# Patient Record
Sex: Male | Born: 1970 | Race: White | Hispanic: No | Marital: Married | Smoking: Current every day smoker
Health system: Southern US, Community
[De-identification: ages and names within clinical notes are randomized; demographics above are authoritative.]

## PROBLEM LIST (undated history)

## (undated) DIAGNOSIS — I2510 Atherosclerotic heart disease of native coronary artery without angina pectoris: Secondary | ICD-10-CM

## (undated) DIAGNOSIS — G43909 Migraine, unspecified, not intractable, without status migrainosus: Secondary | ICD-10-CM

## (undated) DIAGNOSIS — L409 Psoriasis, unspecified: Secondary | ICD-10-CM

## (undated) DIAGNOSIS — M549 Dorsalgia, unspecified: Secondary | ICD-10-CM

## (undated) DIAGNOSIS — E785 Hyperlipidemia, unspecified: Secondary | ICD-10-CM

## (undated) DIAGNOSIS — S0990XA Unspecified injury of head, initial encounter: Secondary | ICD-10-CM

## (undated) HISTORY — PX: APPENDECTOMY: SHX54

## (undated) HISTORY — PX: INGUINAL HERNIA REPAIR: SUR1180

---

## 1991-12-29 HISTORY — PX: FACIAL RECONSTRUCTION SURGERY: SHX631

## 2011-11-25 ENCOUNTER — Ambulatory Visit: Payer: Self-pay | Admitting: Surgery

## 2011-11-25 HISTORY — PX: HERNIA REPAIR: SHX51

## 2012-08-26 ENCOUNTER — Ambulatory Visit: Payer: Self-pay | Admitting: Internal Medicine

## 2014-12-28 HISTORY — PX: CARDIAC CATHETERIZATION: SHX172

## 2015-09-09 ENCOUNTER — Ambulatory Visit
Admission: EM | Admit: 2015-09-09 | Discharge: 2015-09-09 | Disposition: A | Discharge status: Home / Self Care | Payer: BLUE CROSS/BLUE SHIELD | Attending: Family Medicine | Admitting: Family Medicine

## 2015-09-09 ENCOUNTER — Other Ambulatory Visit: Payer: Self-pay

## 2015-09-09 ENCOUNTER — Encounter: Payer: Self-pay | Admitting: Emergency Medicine

## 2015-09-09 DIAGNOSIS — Z720 Tobacco use: Secondary | ICD-10-CM

## 2015-09-09 DIAGNOSIS — R079 Chest pain, unspecified: Secondary | ICD-10-CM

## 2015-09-09 DIAGNOSIS — Z8249 Family history of ischemic heart disease and other diseases of the circulatory system: Secondary | ICD-10-CM | POA: Diagnosis not present

## 2015-09-09 DIAGNOSIS — F17210 Nicotine dependence, cigarettes, uncomplicated: Secondary | ICD-10-CM | POA: Insufficient documentation

## 2015-09-09 HISTORY — DX: Migraine, unspecified, not intractable, without status migrainosus: G43.909

## 2015-09-09 HISTORY — DX: Psoriasis, unspecified: L40.9

## 2015-09-09 HISTORY — DX: Unspecified injury of head, initial encounter: S09.90XA

## 2015-09-09 HISTORY — DX: Dorsalgia, unspecified: M54.9

## 2015-09-09 NOTE — ED Notes (Signed)
Pt reports intermittent CP mid sternal, since last week Monday. Reports CP worse when he was working but eased off when rested. Pt thought it was heartburn. Pt states now worse, doesn't ease off , also tingling bilat arms, nausea, denies vomiting.
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
## 2015-09-09 NOTE — Discharge Instructions (Signed)

## 2015-09-09 NOTE — ED Notes (Signed)
Pt reports he took 2 bayer adult aspirin today (est. )

## 2015-09-09 NOTE — ED Notes (Signed)
Notified [facility], Barbara Cower, that pt is coming by private vehicle.

## 2019-07-22 ENCOUNTER — Encounter: Payer: Self-pay | Admitting: Emergency Medicine

## 2019-07-22 ENCOUNTER — Other Ambulatory Visit: Payer: Self-pay

## 2019-07-22 ENCOUNTER — Emergency Department: Payer: BC Managed Care – PPO

## 2019-07-22 ENCOUNTER — Emergency Department
Admission: EM | Admit: 2019-07-22 | Discharge: 2019-07-22 | Disposition: A | Discharge status: Home / Self Care | Payer: BC Managed Care – PPO | Attending: Emergency Medicine | Admitting: Emergency Medicine

## 2019-07-22 DIAGNOSIS — Y9389 Activity, other specified: Secondary | ICD-10-CM | POA: Diagnosis not present

## 2019-07-22 DIAGNOSIS — Y9289 Other specified places as the place of occurrence of the external cause: Secondary | ICD-10-CM | POA: Diagnosis not present

## 2019-07-22 DIAGNOSIS — F17210 Nicotine dependence, cigarettes, uncomplicated: Secondary | ICD-10-CM | POA: Insufficient documentation

## 2019-07-22 DIAGNOSIS — W311XXA Contact with metalworking machines, initial encounter: Secondary | ICD-10-CM | POA: Diagnosis not present

## 2019-07-22 DIAGNOSIS — S61312A Laceration without foreign body of right middle finger with damage to nail, initial encounter: Secondary | ICD-10-CM | POA: Insufficient documentation

## 2019-07-22 DIAGNOSIS — S62632A Displaced fracture of distal phalanx of right middle finger, initial encounter for closed fracture: Secondary | ICD-10-CM | POA: Insufficient documentation

## 2019-07-22 DIAGNOSIS — Y998 Other external cause status: Secondary | ICD-10-CM | POA: Insufficient documentation

## 2019-07-22 DIAGNOSIS — S61310A Laceration without foreign body of right index finger with damage to nail, initial encounter: Secondary | ICD-10-CM | POA: Insufficient documentation

## 2019-07-22 MED ORDER — OXYCODONE-ACETAMINOPHEN 5-325 MG PO TABS: 1.0000 | ORAL_TABLET | Freq: Four times a day (QID) | ORAL | 0 refills | Status: AC | PRN

## 2019-07-22 MED ORDER — SULFAMETHOXAZOLE-TRIMETHOPRIM 800-160 MG PO TABS: 1.0000 | ORAL_TABLET | Freq: Two times a day (BID) | ORAL | 0 refills | Status: AC

## 2019-07-22 MED ORDER — LIDOCAINE HCL (PF) 1 % IJ SOLN
INTRAMUSCULAR | Status: AC
  Administered 2019-07-22: 5 mL
  Filled 2019-07-22: qty 5

## 2019-07-22 MED ORDER — LIDOCAINE HCL (PF) 1 % IJ SOLN
INTRAMUSCULAR | Status: AC
  Administered 2019-07-22: 20:00:00 5 mL
  Filled 2019-07-22: qty 5

## 2019-07-22 MED ORDER — ONDANSETRON HCL 4 MG PO TABS: 4.0000 mg | ORAL_TABLET | Freq: Three times a day (TID) | ORAL | 0 refills | Status: AC | PRN

## 2019-07-22 MED ORDER — CEPHALEXIN 500 MG PO CAPS: 500.0000 mg | ORAL_CAPSULE | Freq: Three times a day (TID) | ORAL | 0 refills | Status: AC

## 2019-07-22 NOTE — ED Triage Notes (Signed)
Pt arrived via POV with reports of laceration to right index and middle finger from a jointer saw.   Pt states last tetanus shot was 2 years ago.
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
## 2019-11-24 ENCOUNTER — Ambulatory Visit
Admission: EM | Admit: 2019-11-24 | Discharge: 2019-11-24 | Disposition: A | Discharge status: Home / Self Care | Payer: BC Managed Care – PPO | Attending: Family Medicine | Admitting: Family Medicine

## 2019-11-24 ENCOUNTER — Encounter: Payer: Self-pay | Admitting: Emergency Medicine

## 2019-11-24 ENCOUNTER — Other Ambulatory Visit: Payer: Self-pay

## 2019-11-24 DIAGNOSIS — W228XXA Striking against or struck by other objects, initial encounter: Secondary | ICD-10-CM | POA: Diagnosis not present

## 2019-11-24 DIAGNOSIS — S0501XA Injury of conjunctiva and corneal abrasion without foreign body, right eye, initial encounter: Secondary | ICD-10-CM | POA: Diagnosis not present

## 2019-11-24 MED ORDER — ERYTHROMYCIN 5 MG/GM OP OINT: 1.0000 "application " | TOPICAL_OINTMENT | Freq: Four times a day (QID) | OPHTHALMIC | 0 refills | Status: AC

## 2019-11-24 NOTE — Discharge Instructions (Addendum)
Use medication as prescribed. Avoid rubbing. Monitor.  Follow-up with ophthalmology this week as planned.  Follow up with your primary care physician this week as needed. Return to Urgent care for new or worsening concerns.

## 2019-11-24 NOTE — ED Triage Notes (Signed)
Patient states he has a grass clipping stuck in his right eye since this morning.
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
## 2019-11-28 IMAGING — DX RIGHT HAND - COMPLETE 3+ VIEW
3 series · 3 of 3 positions shown · non-contrast
Comparison: None

CLINICAL DATA: Lacerations to distal index and middle fingers from
a saw

EXAM:
RIGHT HAND - COMPLETE 3+ VIEW

[hand ap]
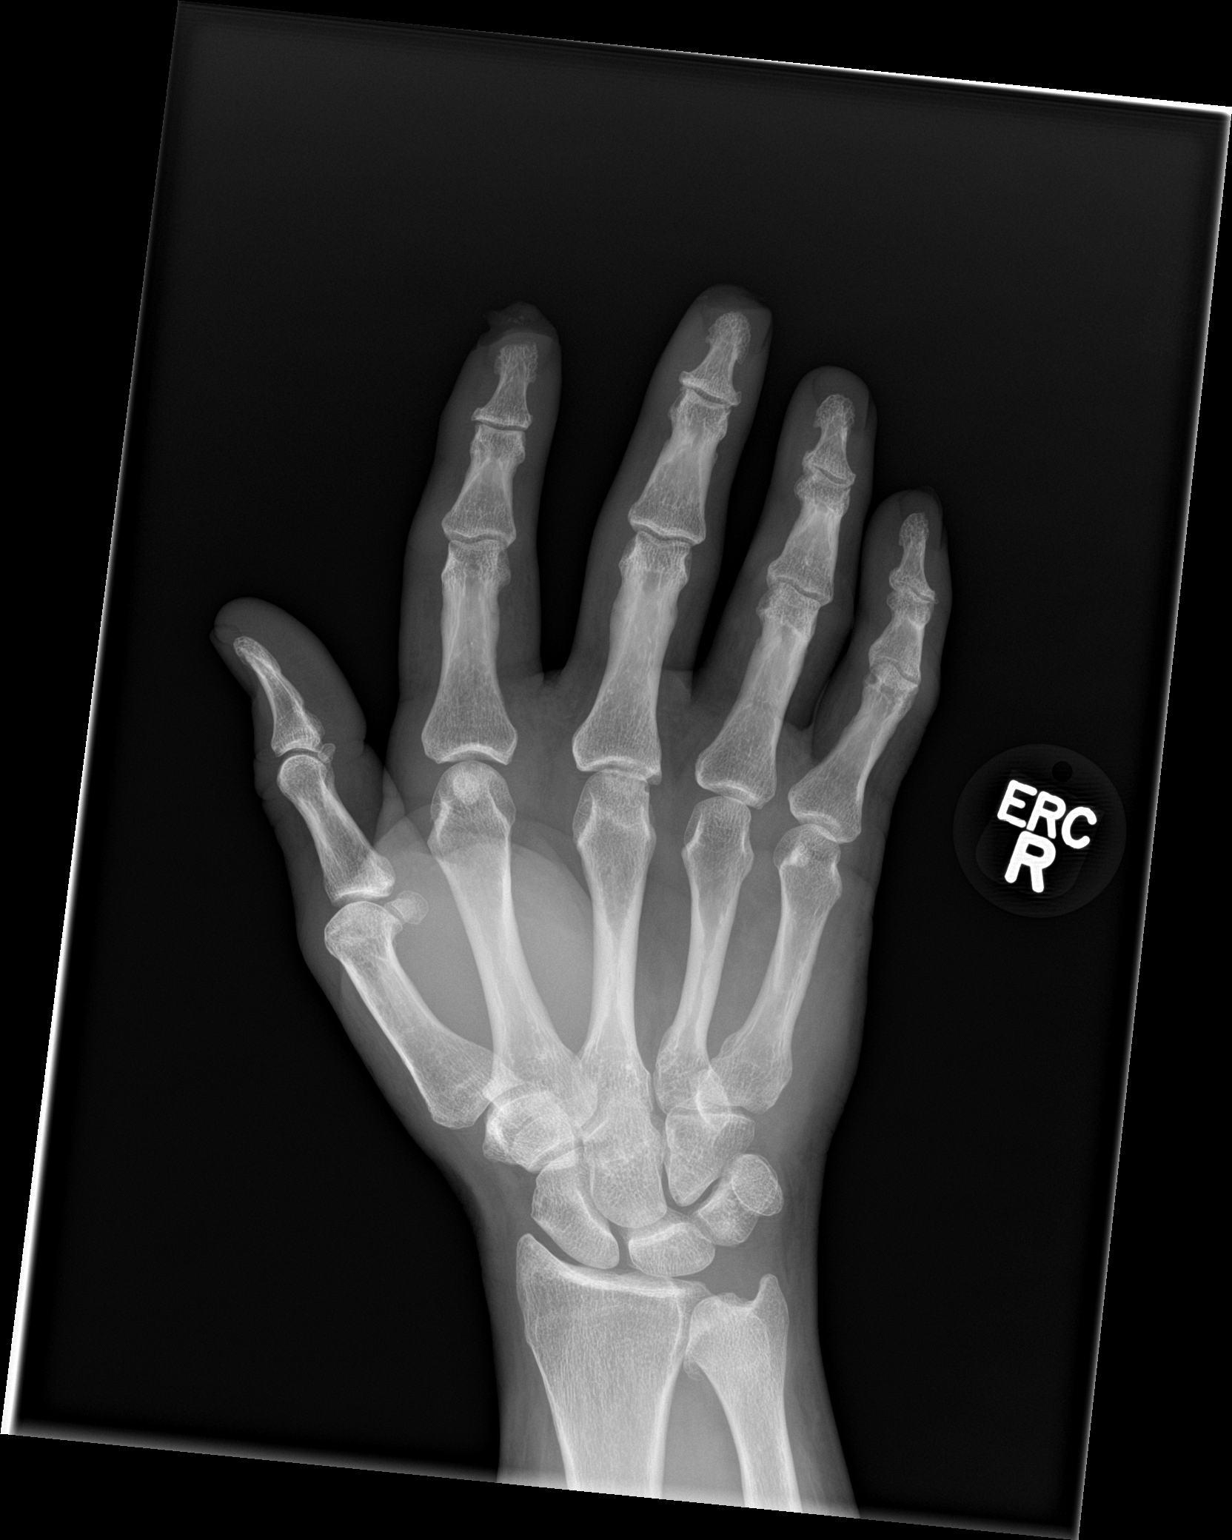

[hand obl]
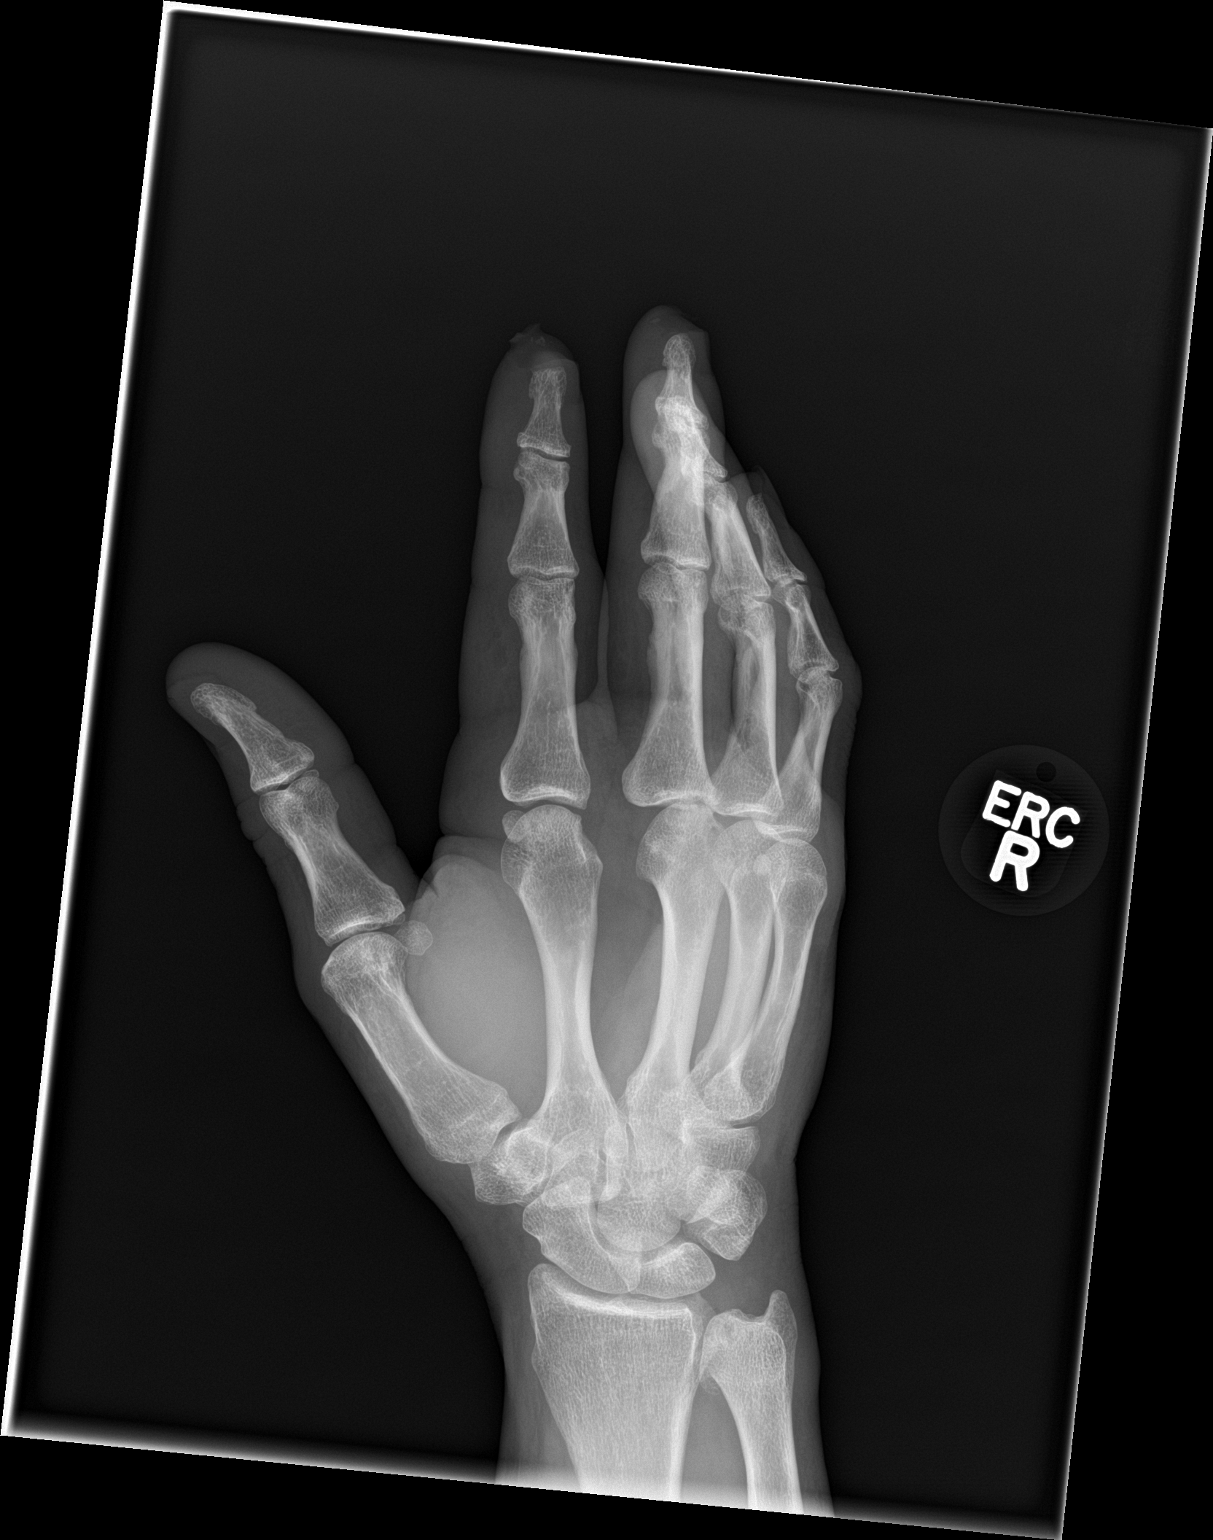

[hand lat]
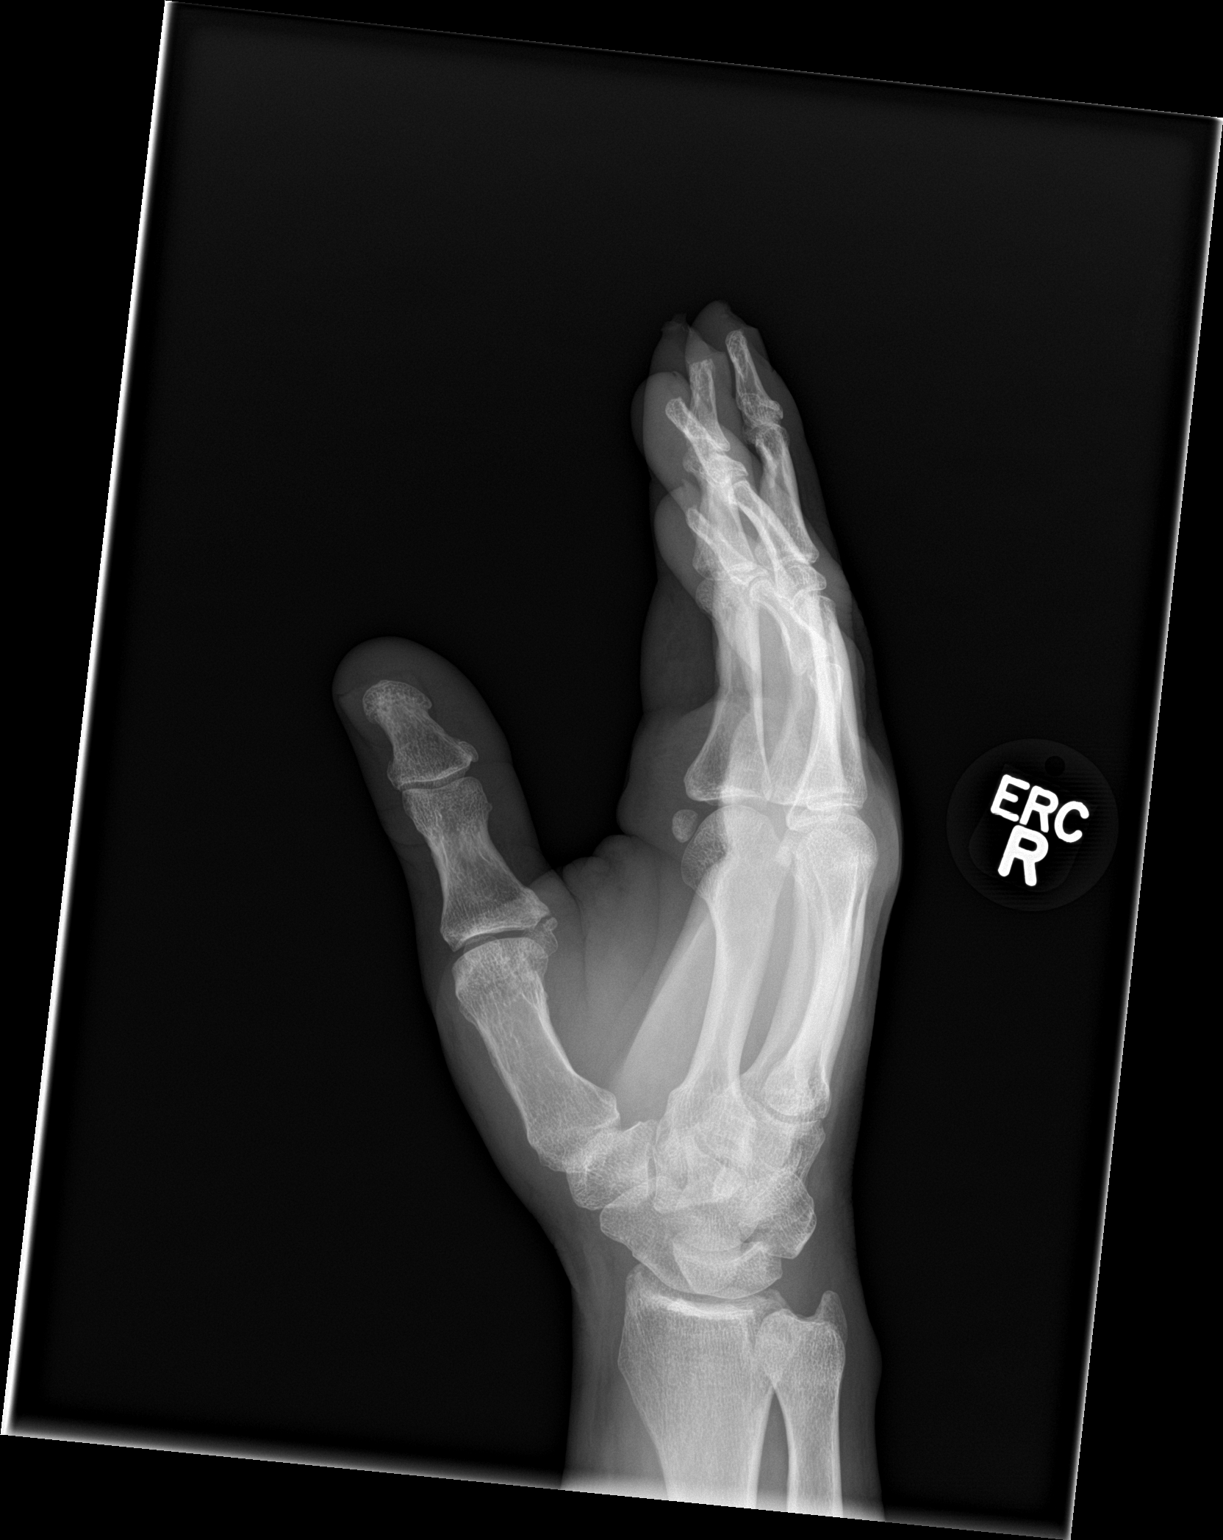

[3 of 3 positions shown; findings below may reference images not displayed]

FINDINGS: Osseous mineralization normal.

Joint spaces preserved.

Soft tissue amputations at the tips of the RIGHT index and middle
fingers.

Tuft fracture identified at tip of distal phalanx of index finger
with probable tiny bone fragments at stump.

No additional fracture, dislocation or bone destruction.
IMPRESSION: Fracture at tuft of distal phalanx, RIGHT index finger with small
fracture fragments at stump.

No definite fracture involving the distal phalanx of the middle
finger is identified.

## 2020-01-10 ENCOUNTER — Ambulatory Visit: Payer: Self-pay | Admitting: Surgery

## 2020-01-10 ENCOUNTER — Other Ambulatory Visit: Payer: Self-pay

## 2020-01-10 ENCOUNTER — Encounter: Payer: Self-pay | Admitting: Surgery
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
## 2020-01-16 ENCOUNTER — Other Ambulatory Visit: Payer: Self-pay

## 2020-01-16 ENCOUNTER — Other Ambulatory Visit
Admission: RE | Admit: 2020-01-16 | Discharge: 2020-01-16 | Disposition: A | Discharge status: Home / Self Care | Payer: BC Managed Care – PPO | Source: Ambulatory Visit | Attending: Surgery | Admitting: Surgery

## 2020-01-16 DIAGNOSIS — Z01812 Encounter for preprocedural laboratory examination: Secondary | ICD-10-CM | POA: Insufficient documentation

## 2020-01-16 DIAGNOSIS — Z20822 Contact with and (suspected) exposure to covid-19: Secondary | ICD-10-CM | POA: Insufficient documentation

## 2020-01-16 LAB — SARS CORONAVIRUS 2 (TAT 6-24 HRS): SARS Coronavirus 2: NEGATIVE

## 2020-01-18 ENCOUNTER — Encounter
Admission: RE | Disposition: A | Discharge status: Home / Self Care | Payer: Self-pay | Source: Home / Self Care | Attending: Surgery

## 2020-01-18 ENCOUNTER — Other Ambulatory Visit: Payer: Self-pay

## 2020-01-18 ENCOUNTER — Ambulatory Visit: Payer: BC Managed Care – PPO | Admitting: Anesthesiology

## 2020-01-18 ENCOUNTER — Ambulatory Visit
Admission: RE | Admit: 2020-01-18 | Discharge: 2020-01-18 | Disposition: A | Discharge status: Home / Self Care | Payer: BC Managed Care – PPO | Attending: Surgery | Admitting: Surgery

## 2020-01-18 ENCOUNTER — Encounter: Payer: Self-pay | Admitting: Surgery

## 2020-01-18 DIAGNOSIS — Z79899 Other long term (current) drug therapy: Secondary | ICD-10-CM | POA: Diagnosis not present

## 2020-01-18 DIAGNOSIS — Z7982 Long term (current) use of aspirin: Secondary | ICD-10-CM | POA: Insufficient documentation

## 2020-01-18 DIAGNOSIS — K4091 Unilateral inguinal hernia, without obstruction or gangrene, recurrent: Secondary | ICD-10-CM | POA: Diagnosis present

## 2020-01-18 DIAGNOSIS — I2510 Atherosclerotic heart disease of native coronary artery without angina pectoris: Secondary | ICD-10-CM | POA: Insufficient documentation

## 2020-01-18 DIAGNOSIS — M1990 Unspecified osteoarthritis, unspecified site: Secondary | ICD-10-CM | POA: Diagnosis not present

## 2020-01-18 DIAGNOSIS — F17210 Nicotine dependence, cigarettes, uncomplicated: Secondary | ICD-10-CM | POA: Insufficient documentation

## 2020-01-18 DIAGNOSIS — I10 Essential (primary) hypertension: Secondary | ICD-10-CM | POA: Diagnosis not present

## 2020-01-18 DIAGNOSIS — E785 Hyperlipidemia, unspecified: Secondary | ICD-10-CM | POA: Diagnosis not present

## 2020-01-18 DIAGNOSIS — Z955 Presence of coronary angioplasty implant and graft: Secondary | ICD-10-CM | POA: Insufficient documentation

## 2020-01-18 HISTORY — DX: Atherosclerotic heart disease of native coronary artery without angina pectoris: I25.10

## 2020-01-18 HISTORY — DX: Hyperlipidemia, unspecified: E78.5

## 2020-01-18 HISTORY — DX: Migraine, unspecified, not intractable, without status migrainosus: G43.909

## 2020-01-18 HISTORY — PX: INGUINAL HERNIA REPAIR: SHX194

## 2020-01-18 SURGERY — REPAIR, HERNIA, INGUINAL, ADULT
Anesthesia: General | Site: Abdomen | Laterality: Right

## 2020-01-18 MED ORDER — BUPIVACAINE LIPOSOME 1.3 % IJ SUSP
INTRAMUSCULAR | Status: DC | PRN
  Administered 2020-01-18: 20 mL

## 2020-01-18 MED ORDER — GABAPENTIN 300 MG PO CAPS
300.0000 mg | ORAL_CAPSULE | ORAL | Status: AC
  Administered 2020-01-18: 11:00:00 300 mg via ORAL

## 2020-01-18 MED ORDER — DEXAMETHASONE SODIUM PHOSPHATE 4 MG/ML IJ SOLN
INTRAMUSCULAR | Status: DC | PRN
  Administered 2020-01-18: 4 mg via INTRAVENOUS

## 2020-01-18 MED ORDER — LIDOCAINE HCL (CARDIAC) PF 100 MG/5ML IV SOSY
PREFILLED_SYRINGE | INTRAVENOUS | Status: DC | PRN
  Administered 2020-01-18: 40 mg via INTRATRACHEAL

## 2020-01-18 MED ORDER — IBUPROFEN 800 MG PO TABS: 800.0000 mg | ORAL_TABLET | Freq: Three times a day (TID) | ORAL | 0 refills | Status: AC | PRN

## 2020-01-18 MED ORDER — ONDANSETRON HCL 4 MG/2ML IJ SOLN
4.0000 mg | Freq: Once | INTRAMUSCULAR | Status: AC | PRN
  Administered 2020-01-18: 4 mg via INTRAVENOUS

## 2020-01-18 MED ORDER — LACTATED RINGERS IV SOLN: INTRAVENOUS | Status: DC

## 2020-01-18 MED ORDER — DOCUSATE SODIUM 100 MG PO CAPS: 100.0000 mg | ORAL_CAPSULE | Freq: Two times a day (BID) | ORAL | 0 refills | Status: AC | PRN

## 2020-01-18 MED ORDER — ACETAMINOPHEN 500 MG PO TABS: 1000.0000 mg | ORAL_TABLET | ORAL | Status: DC

## 2020-01-18 MED ORDER — BUPIVACAINE-EPINEPHRINE (PF) 0.5% -1:200000 IJ SOLN
INTRAMUSCULAR | Status: DC | PRN
  Administered 2020-01-18: 11 mL via PERINEURAL
  Administered 2020-01-18: 9 mL via PERINEURAL

## 2020-01-18 MED ORDER — HYDROCODONE-ACETAMINOPHEN 5-325 MG PO TABS: 1.0000 | ORAL_TABLET | Freq: Four times a day (QID) | ORAL | 0 refills | Status: AC | PRN

## 2020-01-18 MED ORDER — GLYCOPYRROLATE 0.2 MG/ML IJ SOLN
INTRAMUSCULAR | Status: DC | PRN
  Administered 2020-01-18: .1 mg via INTRAVENOUS

## 2020-01-18 MED ORDER — CEFAZOLIN SODIUM-DEXTROSE 2-4 GM/100ML-% IV SOLN
2.0000 g | INTRAVENOUS | Status: AC
  Administered 2020-01-18: 2 g via INTRAVENOUS

## 2020-01-18 MED ORDER — FENTANYL CITRATE (PF) 100 MCG/2ML IJ SOLN
INTRAMUSCULAR | Status: DC | PRN
  Administered 2020-01-18 (×2): 25 ug via INTRAVENOUS

## 2020-01-18 MED ORDER — PROPOFOL 10 MG/ML IV BOLUS
INTRAVENOUS | Status: DC | PRN
  Administered 2020-01-18: 200 mg via INTRAVENOUS

## 2020-01-18 MED ORDER — CELECOXIB 200 MG PO CAPS
200.0000 mg | ORAL_CAPSULE | ORAL | Status: AC
  Administered 2020-01-18: 200 mg via ORAL

## 2020-01-18 MED ORDER — OXYCODONE HCL 5 MG PO TABS
5.0000 mg | ORAL_TABLET | Freq: Once | ORAL | Status: AC | PRN
  Administered 2020-01-18: 13:00:00 5 mg via ORAL

## 2020-01-18 MED ORDER — EPHEDRINE SULFATE 50 MG/ML IJ SOLN
INTRAMUSCULAR | Status: DC | PRN
  Administered 2020-01-18 (×2): 10 mg via INTRAVENOUS
  Administered 2020-01-18: 5 mg via INTRAVENOUS

## 2020-01-18 MED ORDER — CHLORHEXIDINE GLUCONATE CLOTH 2 % EX PADS: 6.0000 | MEDICATED_PAD | Freq: Once | CUTANEOUS | Status: DC

## 2020-01-18 MED ORDER — ACETAMINOPHEN 325 MG PO TABS: 650.0000 mg | ORAL_TABLET | Freq: Three times a day (TID) | ORAL | 0 refills | Status: AC | PRN

## 2020-01-18 MED ORDER — ACETAMINOPHEN 10 MG/ML IV SOLN: 1000.0000 mg | Freq: Once | INTRAVENOUS | Status: DC

## 2020-01-18 MED ORDER — MIDAZOLAM HCL 5 MG/5ML IJ SOLN
INTRAMUSCULAR | Status: DC | PRN
  Administered 2020-01-18: 2 mg via INTRAVENOUS

## 2020-01-18 SURGICAL SUPPLY — 39 items
BLADE CLIPPER SURG (BLADE) ×3 IMPLANT
BLADE SURG 15 STRL LF DISP TIS (BLADE) ×1 IMPLANT
BLADE SURG 15 STRL SS (BLADE) ×2
CANISTER SUCT 1200ML W/VALVE (MISCELLANEOUS) ×3 IMPLANT
CHLORAPREP W/TINT 26 (MISCELLANEOUS) ×3 IMPLANT
COVER LIGHT HANDLE UNIVERSAL (MISCELLANEOUS) ×6 IMPLANT
DERMABOND ADVANCED (GAUZE/BANDAGES/DRESSINGS) ×2
DERMABOND ADVANCED .7 DNX12 (GAUZE/BANDAGES/DRESSINGS) ×1 IMPLANT
DEVICE EDHE SAFETY 22GA 1.5IN (NEEDLE) ×6 IMPLANT
DRAIN PENROSE 1/4X12 LTX STRL (WOUND CARE) ×3 IMPLANT
DRAPE LAPAROTOMY T 102X78X121 (DRAPES) ×3 IMPLANT
ELECT REM PT RETURN 9FT ADLT (ELECTROSURGICAL) ×3
ELECTRODE REM PT RTRN 9FT ADLT (ELECTROSURGICAL) ×1 IMPLANT
GLOVE BIO SURGEON STRL SZ7.5 (GLOVE) ×3 IMPLANT
GLOVE BIOGEL PI IND STRL 7.0 (GLOVE) ×1 IMPLANT
GLOVE BIOGEL PI IND STRL 8 (GLOVE) ×1 IMPLANT
GLOVE BIOGEL PI INDICATOR 7.0 (GLOVE) ×2
GLOVE BIOGEL PI INDICATOR 8 (GLOVE) ×2
GOWN STRL REUS W/ TWL LRG LVL3 (GOWN DISPOSABLE) ×3 IMPLANT
GOWN STRL REUS W/TWL LRG LVL3 (GOWN DISPOSABLE) ×6
KIT TURNOVER KIT A (KITS) ×3 IMPLANT
MESH SYNTHETIC 1.8X4 KEYHOLE S (Mesh General) ×1 IMPLANT
MESH SYNTHETIC KEYHOLE S (Mesh General) ×2 IMPLANT
NS IRRIG 500ML POUR BTL (IV SOLUTION) ×3 IMPLANT
PACK BASIN MINOR ARMC (MISCELLANEOUS) ×3 IMPLANT
PENCIL SMOKE EVACUATOR (MISCELLANEOUS) ×3 IMPLANT
SPONGE LAP 18X18 RF (DISPOSABLE) ×6 IMPLANT
SUT ETHIBOND NAB MO 7 #0 18IN (SUTURE) ×3 IMPLANT
SUT MNCRL 4-0 (SUTURE) ×2
SUT MNCRL 4-0 27XMFL (SUTURE) ×1
SUT VIC AB 2-0 CT2 27 (SUTURE) ×3 IMPLANT
SUT VIC AB 3-0 SH 27 (SUTURE) ×2
SUT VIC AB 3-0 SH 27X BRD (SUTURE) ×1 IMPLANT
SUTURE MNCRL 4-0 27XMF (SUTURE) ×1 IMPLANT
SYR 10ML LL (SYRINGE) ×3 IMPLANT
SYR 20ML LL LF (SYRINGE) ×3 IMPLANT
SYR BULB IRRIG 60ML STRL (SYRINGE) ×3 IMPLANT
TOWEL OR 17X26 4PK STRL BLUE (TOWEL DISPOSABLE) ×3 IMPLANT
VIROSAFE FLUID TRAP (PARTS (SERVICE/REPAIRS)) ×3 IMPLANT

## 2020-01-18 NOTE — Interval H&P Note (Signed)
History and Physical Interval Note:  01/18/2020 10:44 AM  Lance Jackson  has presented today for surgery, with the diagnosis of K40.91 Unilateral recurrent inguinal hernia without obstruction or gangrene.  The various methods of treatment have been discussed with the patient and family. After consideration of risks, benefits and other options for treatment, the patient has consented to  Procedure(s): HERNIA REPAIR INGUINAL ADULT- Open (Right) as a surgical intervention.  The patient's history has been reviewed, patient examined, no change in status, stable for surgery.  I have reviewed the patient's chart and labs.  Questions were answered to the patient's satisfaction.     Bransen Fassnacht Tonna Boehringer

## 2020-01-18 NOTE — Discharge Instructions (Signed)
Hernia repair, Care After This sheet gives you information about how to care for yourself after your procedure. Your health care provider may also give you more specific instructions. If you have problems or questions, contact your health care provider. What can I expect after the procedure? After your procedure, it is common to have the following:  Pain in your abdomen, especially in the incision areas. You will be given medicine to control the pain.  Tiredness. This is a normal part of the recovery process. Your energy level will return to normal over the next several weeks.  Changes in your bowel movements, such as constipation or needing to go more often. Talk with your health care provider about how to manage this. Follow these instructions at home: Medicines   tylenol and advil as needed for discomfort.  Please alternate between the two every four hours as needed for pain.     Use narcotics, if prescribed, only when tylenol and motrin is not enough to control pain.   325-650mg every 8hrs to max of 3000mg/24hrs (including the 325mg in every norco dose) for the tylenol.     Advil up to 800mg per dose every 8hrs as needed for pain.    PLEASE RECORD NUMBER OF PILLS TAKEN UNTIL NEXT FOLLOW UP APPT.  THIS WILL HELP DETERMINE HOW READY YOU ARE TO BE RELEASED FROM ANY ACTIVITY RESTRICTIONS  Do not drive or use heavy machinery while taking prescription pain medicine.  Do not drink alcohol while taking prescription pain medicine.  Incision care     Follow instructions from your health care provider about how to take care of your incision areas. Make sure you: ? Keep your incisions clean and dry. ? Wash your hands with soap and water before and after applying medicine to the areas, and before and after changing your bandage (dressing). If soap and water are not available, use hand sanitizer. ? Change your dressing as told by your health care provider. ? Leave stitches (sutures), skin  glue, or adhesive strips in place. These skin closures may need to stay in place for 2 weeks or longer. If adhesive strip edges start to loosen and curl up, you may trim the loose edges. Do not remove adhesive strips completely unless your health care provider tells you to do that.  Do not wear tight clothing over the incisions. Tight clothing may rub and irritate the incision areas, which may cause the incisions to open.  Do not take baths, swim, or use a hot tub until your health care provider approves. OK TO SHOWER IN 24HRS.    Check your incision area every day for signs of infection. Check for: ? More redness, swelling, or pain. ? More fluid or blood. ? Warmth. ? Pus or a bad smell. Activity  Avoid lifting anything that is heavier than 10 lb (4.5 kg) for 2 weeks or until your health care provider says it is okay.  No pushing/pulling greater than 30lbs  You may resume normal activities as told by your health care provider. Ask your health care provider what activities are safe for you.  Take rest breaks during the day as needed. Eating and drinking  Follow instructions from your health care provider about what you can eat after surgery.  To prevent or treat constipation while you are taking prescription pain medicine, your health care provider may recommend that you: ? Drink enough fluid to keep your urine clear or pale yellow. ? Take over-the-counter or prescription medicines. ?   Eat foods that are high in fiber, such as fresh fruits and vegetables, whole grains, and beans. ? Limit foods that are high in fat and processed sugars, such as fried and sweet foods. General instructions  Ask your health care provider when you will need an appointment to get your sutures or staples removed.  Keep all follow-up visits as told by your health care provider. This is important. Contact a health care provider if:  You have more redness, swelling, or pain around your incisions.  You have  more fluid or blood coming from the incisions.  Your incisions feel warm to the touch.  You have pus or a bad smell coming from your incisions or your dressing.  You have a fever.  You have an incision that breaks open (edges not staying together) after sutures or staples have been removed. Get help right away if:  You develop a rash.  You have chest pain or difficulty breathing.  You have pain or swelling in your legs.  You feel light-headed or you faint.  Your abdomen swells (becomes distended).  You have nausea or vomiting.  You have blood in your stool (feces). This information is not intended to replace advice given to you by your health care provider. Make sure you discuss any questions you have with your health care provider. Document Released: 07/03/2005 Document Revised: 09/02/2018 Document Reviewed: 09/14/2016 Elsevier Interactive Patient Education  2019 [employer]    General Anesthesia, Adult, Care After This sheet gives you information about how to care for yourself after your procedure. Your health care provider may also give you more specific instructions. If you have problems or questions, contact your health care provider. What can I expect after the procedure? After the procedure, the following side effects are common:  Pain or discomfort at the IV site.  Nausea.  Vomiting.  Sore throat.  Trouble concentrating.  Feeling cold or chills.  Weak or tired.  Sleepiness and fatigue.  Soreness and body aches. These side effects can affect parts of the body that were not involved in surgery. Follow these instructions at home:  For at least 24 hours after the procedure:  Have a responsible adult stay with you. It is important to have someone help care for you until you are awake and alert.  Rest as needed.  Do not: ? Participate in activities in which you could fall or become injured. ? Drive. ? Use heavy machinery. ? Drink alcohol. ? Take  sleeping pills or medicines that cause drowsiness. ? Make important decisions or sign legal documents. ? Take care of children on your own. Eating and drinking  Follow any instructions from your health care provider about eating or drinking restrictions.  When you feel hungry, start by eating small amounts of foods that are soft and easy to digest (bland), such as toast. Gradually return to your regular diet.  Drink enough fluid to keep your urine pale yellow.  If you vomit, rehydrate by drinking water, juice, or clear broth. General instructions  If you have sleep apnea, surgery and certain medicines can increase your risk for breathing problems. Follow instructions from your health care provider about wearing your sleep device: ? Anytime you are sleeping, including during daytime naps. ? While taking prescription pain medicines, sleeping medicines, or medicines that make you drowsy.  Return to your normal activities as told by your health care provider. Ask your health care provider what activities are safe for you.  Take over-the-counter and prescription  medicines only as told by your health care provider.  If you smoke, do not smoke without supervision.  Keep all follow-up visits as told by your health care provider. This is important. Contact a health care provider if:  You have nausea or vomiting that does not get better with medicine.  You cannot eat or drink without vomiting.  You have pain that does not get better with medicine.  You are unable to pass urine.  You develop a skin rash.  You have a fever.  You have redness around your IV site that gets worse. Get help right away if:  You have difficulty breathing.  You have chest pain.  You have blood in your urine or stool, or you vomit blood. Summary  After the procedure, it is common to have a sore throat or nausea. It is also common to feel tired.  Have a responsible adult stay with you for the first 24  hours after general anesthesia. It is important to have someone help care for you until you are awake and alert.  When you feel hungry, start by eating small amounts of foods that are soft and easy to digest (bland), such as toast. Gradually return to your regular diet.  Drink enough fluid to keep your urine pale yellow.  Return to your normal activities as told by your health care provider. Ask your health care provider what activities are safe for you. This information is not intended to replace advice given to you by your health care provider. Make sure you discuss any questions you have with your health care provider. Document Revised: 12/17/2017 Document Reviewed: 07/30/2017 Elsevier Patient Education  [place].

## 2020-01-18 NOTE — Anesthesia Postprocedure Evaluation (Signed)
Anesthesia Post Note  Patient: Lance Jackson [facility]  Procedure(s) Performed: HERNIA REPAIR INGUINAL ADULT- Open (Right Abdomen)     Patient location during evaluation: PACU Anesthesia Type: General Level of consciousness: awake Pain management: pain level controlled Vital Signs Assessment: post-procedure vital signs reviewed and stable Respiratory status: respiratory function stable Cardiovascular status: stable Postop Assessment: no signs of nausea or vomiting Anesthetic complications: no    Jola Babinski

## 2020-01-18 NOTE — Transfer of Care (Signed)
Immediate Anesthesia Transfer of Care Note  Patient: Lance Jackson [facility]  Procedure(s) Performed: HERNIA REPAIR INGUINAL ADULT- Open (Right Abdomen)  Patient Location: PACU  Anesthesia Type: General  Level of Consciousness: awake, alert  and patient cooperative  Airway and Oxygen Therapy: Patient Spontanous Breathing and Patient connected to supplemental oxygen  Post-op Assessment: Post-op Vital signs reviewed, Patient's Cardiovascular Status Stable, Respiratory Function Stable, Patent Airway and No signs of Nausea or vomiting  Post-op Vital Signs: Reviewed and stable  Complications: No apparent anesthesia complications

## 2020-01-18 NOTE — Anesthesia Procedure Notes (Signed)
Procedure Name: LMA Insertion Date/Time: 01/18/2020 11:28 AM Performed by: Maree Krabbe, CRNA Pre-anesthesia Checklist: Patient identified, Emergency Drugs available, Suction available, Timeout performed and Patient being monitored Patient Re-evaluated:Patient Re-evaluated prior to induction Oxygen Delivery Method: Circle system utilized Preoxygenation: Pre-oxygenation with 100% oxygen Induction Type: IV induction LMA: LMA inserted LMA Size: 4.0 Number of attempts: 1 Placement Confirmation: positive ETCO2 and breath sounds checked- equal and bilateral Tube secured with: Tape Dental Injury: Teeth and Oropharynx as per pre-operative assessment

## 2020-01-18 NOTE — Op Note (Signed)
Preoperative diagnosis: right recurrent Inguinal Hernia.  Postoperative diagnosis: right direct recurrent  Inguinal Hernia  Procedure:  Open right Inguinal hernia repair with mesh  Anesthesia: General, LMA  Surgeon: Dr. Tonna Boehringer  Wound Classification: Clean  Specimen: none  Complications: None  Estimated Blood Loss: 8mL   Indications:  Patient is a 49 y.o. male developed a symptomatic right inguinal hernia. Repair was indicated to avoid complications of incarceration, obstruction and pain, and a prosthetic mesh repair was elected.  Findings: 1. Vas Deferens and cord structures identified and preserved 2. Keyhole medium mesh used for repair 3. Adequate hemostasis achieved  Description of procedure: The patient was taken to the operating room. A time-out was completed verifying correct patient, procedure, site, positioning, and implant(s) and/or special equipment prior to beginning this procedure. The right groin was prepped and draped in the usual sterile fashion. An incision was marked in a natural skin crease and planned to end near the pubic tubercle.  The skin crease incision was made with a knife and deepened through Scarpa's and Camper's fascia with electrocautery until the aponeurosis of the external oblique was encountered. This was cleaned and the external ring was exposed. Hemostasis was achieved in the wound. An incision was made in the midportion of the external oblique aponeurosis in the direction of its fibers. The ilioinguinal nerve was identified and protected throughout the dissection. Flaps of the external oblique were developed cephalad and inferiorly.  The cord was identified. It was gently dissected free at the pubic tubercle and encircled with a Penrose drain. Attention was directed to the anteromedial aspect of the cord, where no indirect hernia sac was identified. an adjacent cord lipoma was ligated. The vas and testicular vessels were identified and protected from  harm.   Attention then turned to the floor of the canal, which was showed small direct hernia with preperitoneal fat. This was reduced and edges easily approximated with interrupted 0 ethibond x3. The medium keyhole mesh was inserted and secured to the pubic tubercle using interrupted 0 ethibond sutures. Care was taken to assure that the mesh was placed flat against the floor and wrapped loosely around the cord structures. This was then secured over the direct hernia repair with interrupted 0 ethibond in circumferential fashion.  Hemostasis was again checked. The Penrose drain was removed. Exparel infused as an ilioinguinal block.  The external oblique aponeurosis was closed with a running suture of 2-0 Vicryl, taking care not to catch the ilioinguinal nerve in the suture line. Scarpa's fascia was closed with interrupted 3-0 Vicryl.  The deep dermal layer closed with interrupted 3-0 Vicryl.  The skin was closed with a subcuticular stitch of Monocryl 4-0. Dermabond was applied.  The testis was gently pulled down into its anatomic position in the scrotum.  The patient tolerated the procedure well and was taken to the postanesthesia care unit in stable condition. Sponge and instrument count correct at end of procedure.
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
## 2020-01-19 ENCOUNTER — Encounter: Payer: Self-pay | Admitting: *Deleted
